# Patient Record
Sex: Male | Born: 1985 | Race: Black or African American | Hispanic: No | Marital: Single | Smoking: Current every day smoker
Health system: Southern US, Community
[De-identification: ages and names within clinical notes are randomized; demographics above are authoritative.]

---

## 2014-11-19 DIAGNOSIS — N508 Other specified disorders of male genital organs: Secondary | ICD-10-CM | POA: Insufficient documentation

## 2014-11-19 DIAGNOSIS — Z720 Tobacco use: Secondary | ICD-10-CM | POA: Insufficient documentation

## 2014-11-19 DIAGNOSIS — L98499 Non-pressure chronic ulcer of skin of other sites with unspecified severity: Secondary | ICD-10-CM | POA: Insufficient documentation

## 2014-11-20 ENCOUNTER — Emergency Department (HOSPITAL_COMMUNITY)
Admission: EM | Admit: 2014-11-20 | Discharge: 2014-11-20 | Disposition: A | Discharge status: Home / Self Care | Payer: Self-pay | Attending: Emergency Medicine | Admitting: Emergency Medicine

## 2014-11-20 ENCOUNTER — Emergency Department (HOSPITAL_COMMUNITY): Payer: Self-pay

## 2014-11-20 ENCOUNTER — Encounter (HOSPITAL_COMMUNITY): Payer: Self-pay | Admitting: Emergency Medicine

## 2014-11-20 DIAGNOSIS — N50812 Left testicular pain: Secondary | ICD-10-CM

## 2014-11-20 DIAGNOSIS — N5089 Other specified disorders of the male genital organs: Secondary | ICD-10-CM

## 2014-11-20 LAB — URINALYSIS, ROUTINE W REFLEX MICROSCOPIC
Bilirubin Urine: NEGATIVE
GLUCOSE, UA: NEGATIVE mg/dL
HGB URINE DIPSTICK: NEGATIVE
KETONES UR: 15 mg/dL — AB
Leukocytes, UA: NEGATIVE
Nitrite: NEGATIVE
PROTEIN: NEGATIVE mg/dL
Specific Gravity, Urine: 1.026 (ref 1.005–1.030)
Urobilinogen, UA: 1 mg/dL (ref 0.0–1.0)
pH: 6.5 (ref 5.0–8.0)

## 2014-11-20 LAB — GC/CHLAMYDIA PROBE AMP (~~LOC~~) NOT AT ARMC
CHLAMYDIA, DNA PROBE: NEGATIVE
NEISSERIA GONORRHEA: NEGATIVE

## 2014-11-20 MED ORDER — MORPHINE SULFATE 4 MG/ML IJ SOLN
4.0000 mg | Freq: Once | INTRAMUSCULAR | Status: AC
  Administered 2014-11-20: 4 mg via INTRAVENOUS
  Filled 2014-11-20: qty 1

## 2014-11-20 MED ORDER — SODIUM CHLORIDE 0.9 % IV BOLUS (SEPSIS)
1000.0000 mL | Freq: Once | INTRAVENOUS | Status: AC
  Administered 2014-11-20: 1000 mL via INTRAVENOUS

## 2014-11-20 MED ORDER — NAPROXEN 500 MG PO TABS: 500.0000 mg | ORAL_TABLET | Freq: Two times a day (BID) | ORAL | Status: AC

## 2014-11-20 NOTE — Discharge Instructions (Signed)
Pain of Unknown Etiology (Pain Without a Known Cause) °You have come to your caregiver because of pain. Pain can occur in any part of the body. Often there is not a definite cause. If your laboratory (blood or urine) work was normal and X-rays or other studies were normal, your caregiver may treat you without knowing the cause of the pain. An example of this is the headache. Most headaches are diagnosed by taking a history. This means your caregiver asks you questions about your headaches. Your caregiver determines a treatment based on your answers. Usually testing done for headaches is normal. Often testing is not done unless there is no response to medications. Regardless of where your pain is located today, you can be given medications to make you comfortable. If no physical cause of pain can be found, most cases of pain will gradually leave as suddenly as they came.  °If you have a painful condition and no reason can be found for the pain, it is important that you follow up with your caregiver. If the pain becomes worse or does not go away, it may be necessary to repeat tests and look further for a possible cause. °· Only take over-the-counter or prescription medicines for pain, discomfort, or fever as directed by your caregiver. °· For the protection of your privacy, test results cannot be given over the phone. Make sure you receive the results of your test. Ask how these results are to be obtained if you have not been informed. It is your responsibility to obtain your test results. °· You may continue all activities unless the activities cause more pain. When the pain lessens, it is important to gradually resume normal activities. Resume activities by beginning slowly and gradually increasing the intensity and duration of the activities or exercise. During periods of severe pain, bed rest may be helpful. Lie or sit in any position that is comfortable. °· Ice used for acute (sudden) conditions may be effective.  Use a large plastic bag filled with ice and wrapped in a towel. This may provide pain relief. °· See your caregiver for continued problems. Your caregiver can help or refer you for exercises or physical therapy if necessary. °If you were given medications for your condition, do not drive, operate machinery or power tools, or sign legal documents for 24 hours. Do not drink alcohol, take sleeping pills, or take other medications that may interfere with treatment. °See your caregiver immediately if you have pain that is becoming worse and not relieved by medications. °Document Released: 07/13/2001 Document Revised: 08/08/2013 Document Reviewed: 10/18/2005 °[employer]® Patient Information ©2015 [employer]. This information is not intended to replace advice given to you by your health care provider. Make sure you discuss any questions you have with your health care provider. ° °

## 2014-11-20 NOTE — ED Notes (Signed)
Pt. reports bilateral testicular pain and left lower back pain onset this today , denies injury , no fever or chills.

## 2014-11-20 NOTE — ED Notes (Signed)
PA-C at bedside 

## 2014-11-20 NOTE — ED Provider Notes (Signed)
CSN: [id number]     Arrival date & time 11/19/14  2356 History   First MD Initiated Contact with Patient 11/20/14 0004     Chief Complaint  Patient presents with  . Testicle Pain  . Back Pain     (Consider location/radiation/quality/duration/timing/severity/associated sxs/prior Treatment) HPI Comments: The patient is a 29 year old male presenting emergency room chief complaint of bilateral testicular pain since today. Patient reports onset of discomfort while showering. He reports left testicular discomfort greater than right. He denies swelling, lesion. Patient denies relief with elevation of testes, worsened with movement or other aggravating factors. He reports left testicular pain, with radiation into the back, constant. Denies abdominal pain, nausea, vomiting, diarrhea, constipation, dysuria. Patient also reports a rash to right penis for approximately one week. He reports this is resolving with hydrocortisone cream he denies discomfort or prodrome to the area. Patient denies history of herpes.  The history is provided by the patient. No language interpreter was used.    History reviewed. No pertinent past medical history. History reviewed. No pertinent past surgical history. No family history on file. History  Substance Use Topics  . Smoking status: Current Every Day Smoker  . Smokeless tobacco: Not on file  . Alcohol Use: Yes    Review of Systems  Gastrointestinal: Negative for nausea, vomiting, abdominal pain and diarrhea.  Genitourinary: Positive for genital sores and testicular pain. Negative for dysuria, hematuria, discharge and scrotal swelling.  Musculoskeletal: Positive for back pain.      Allergies  Review of patient's allergies indicates no known allergies.  Home Medications   Prior to Admission medications   Not on File   BP 103/69 mmHg  Pulse 92  Temp(Src) 98 F (36.7 C) (Oral)  Resp 16  Ht 5\' 8"  (1.727 m)  Wt 140 lb (63.504 kg)  BMI 21.29 kg/m2   SpO2 97% Physical Exam  Constitutional: He is oriented to person, place, and time. He appears well-developed and well-nourished. No distress.  HENT:  Head: Normocephalic and atraumatic.  Eyes: EOM are normal.  Neck: Neck supple.  Abdominal: Soft. There is no tenderness.  Genitourinary: Right testis shows no swelling and no tenderness. Left testis shows tenderness. Left testis shows no mass. No discharge found.  Grouped ulcerative lesions to right penis shaft.  Negative Prehn's sign.  No scrotal skin changes. Chaperone present.   Musculoskeletal: Normal range of motion.  Lymphadenopathy:       Right: No inguinal adenopathy present.       Left: No inguinal adenopathy present.  Neurological: He is alert and oriented to person, place, and time.  Skin: Skin is warm and dry.  Psychiatric: He has a normal mood and affect. His behavior is normal.  Nursing note and vitals reviewed.   ED Course  Procedures (including critical care time) Labs Review Labs Reviewed  RPR  HIV ANTIBODY (ROUTINE TESTING)  URINALYSIS, ROUTINE W REFLEX MICROSCOPIC  GC/CHLAMYDIA PROBE AMP ([place])    Imaging Review No results found.   EKG Interpretation None      MDM   Final diagnoses:  Testicular pain, left  Genital lesion, male   Pt with left testicular pain, no swelling or point tenderness. Pt with lesions to penis consistent with herpetic rash.  Plan to US to rule out torsion. Pt care assumed by Dr. Micheline Mazeocherty at shift change.    Mellody DrownLauren Morghan Kester, PA-C 11/20/14 [phone]  Toy CookeyMegan Docherty, MD 11/22/14 1058

## 2014-11-21 LAB — HIV ANTIBODY (ROUTINE TESTING W REFLEX): HIV 1/HIV 2 AB: NONREACTIVE

## 2014-11-21 LAB — RPR: RPR Ser Ql: NONREACTIVE

## 2016-04-16 ENCOUNTER — Emergency Department (HOSPITAL_COMMUNITY): Admission: EM | Admit: 2016-04-16 | Discharge: 2016-04-16 | Discharge status: Absent without leave | Payer: Self-pay

## 2016-04-16 NOTE — ED Notes (Signed)
Called for triage a second time and no answer

## 2016-04-16 NOTE — ED Notes (Signed)
Called 2x for triage and no answer

## 2016-06-17 IMAGING — US US ART/VEN ABD/PELV/SCROTUM DOPPLER LTD
1 series · 14 of 25 positions shown · non-contrast
Comparison: None.

CLINICAL DATA: Testicular pain on the left

EXAM:
SCROTAL ULTRASOUND
DOPPLER ULTRASOUND OF THE TESTICLES
TECHNIQUE: Complete ultrasound examination of the testicles, epididymis, and
other scrotal structures was performed. Color and spectral Doppler
ultrasound were also utilized to evaluate blood flow to the
testicles.

[Series 1: us art/ven abd/pelv/scrotum doppler ltd · 0.06mm/px · 14 of 49 slices shown]
[im 1/49]
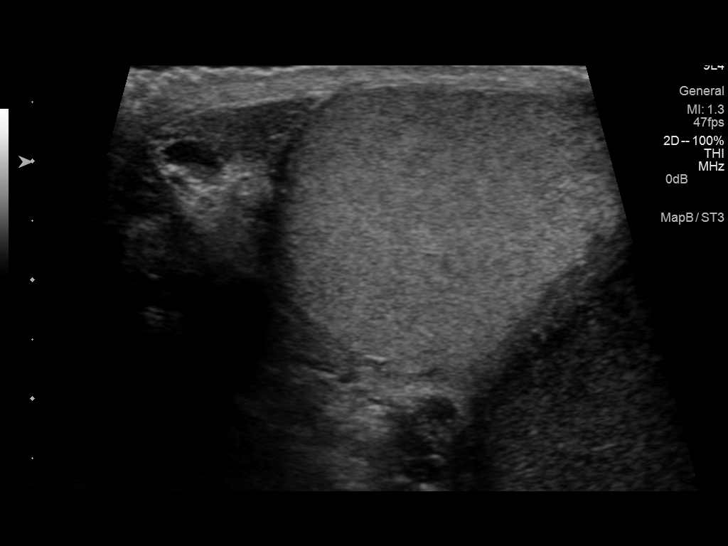
[im 5/49]
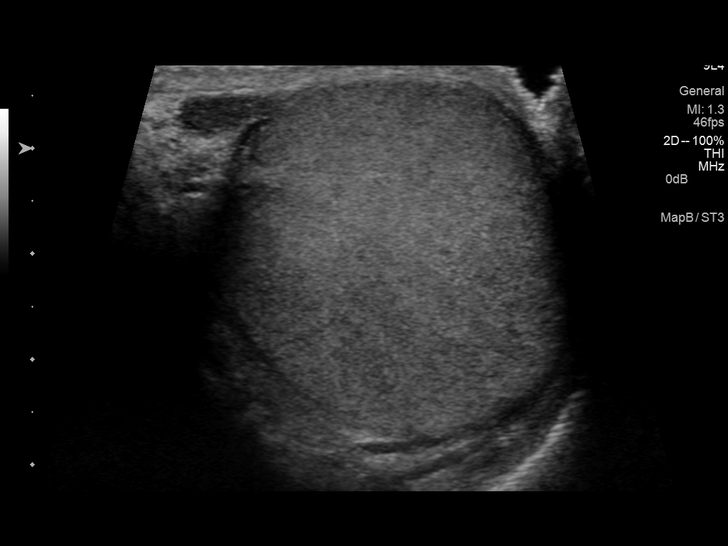
[im 9/49]
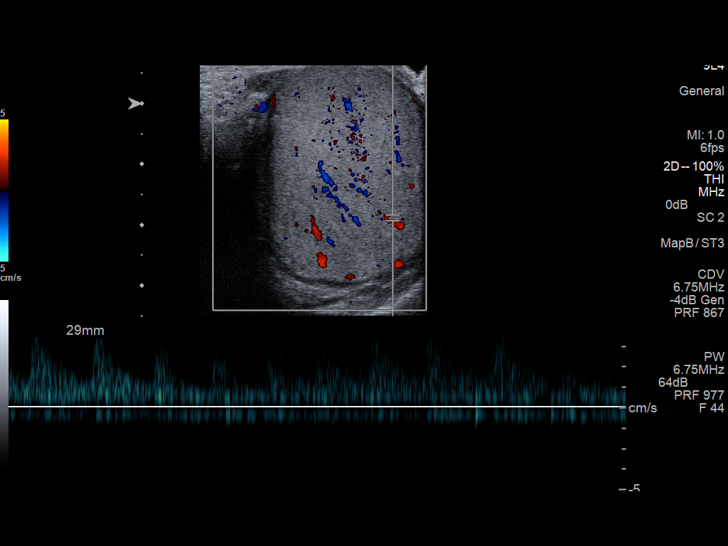
[im 13/49]
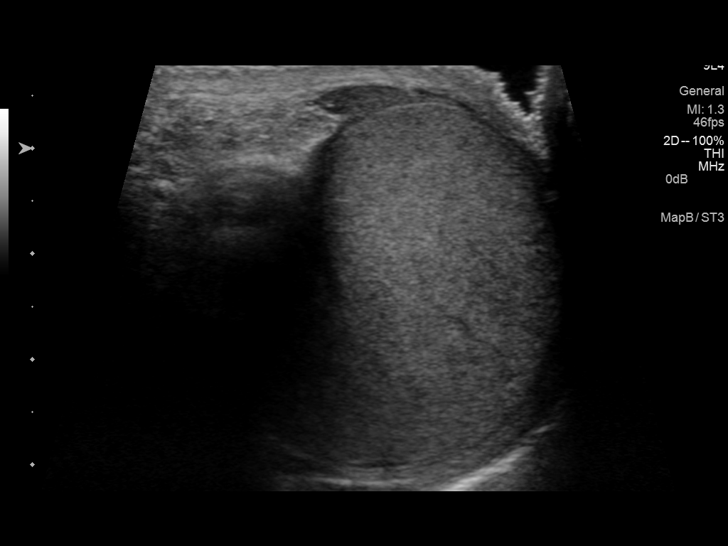
[im 17/49]
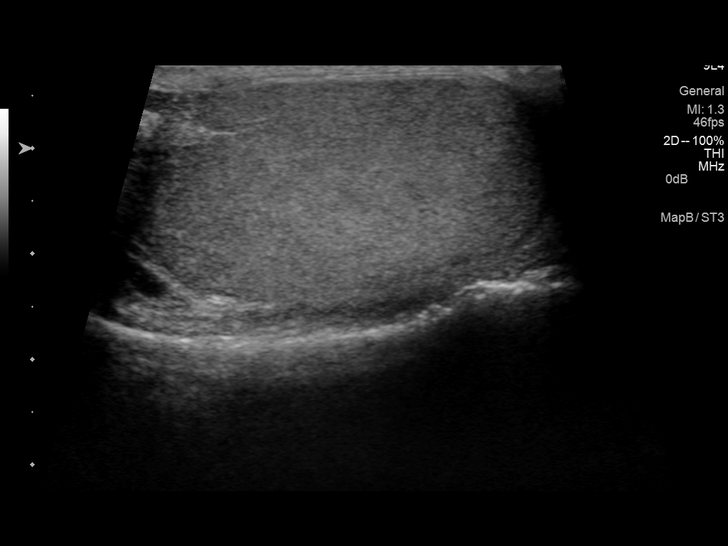
[im 19/49]
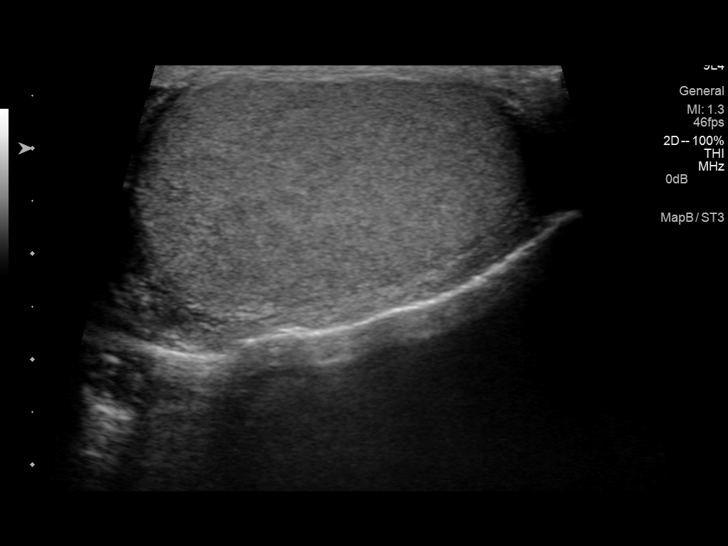
[im 23/49]
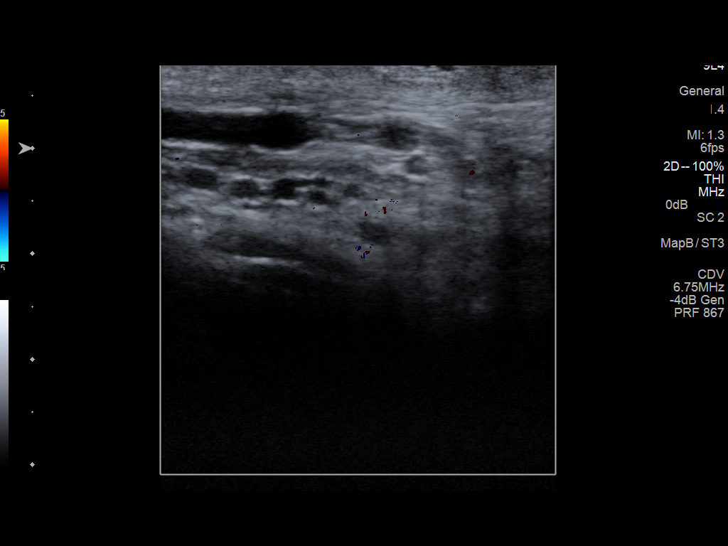
[im 27/49]
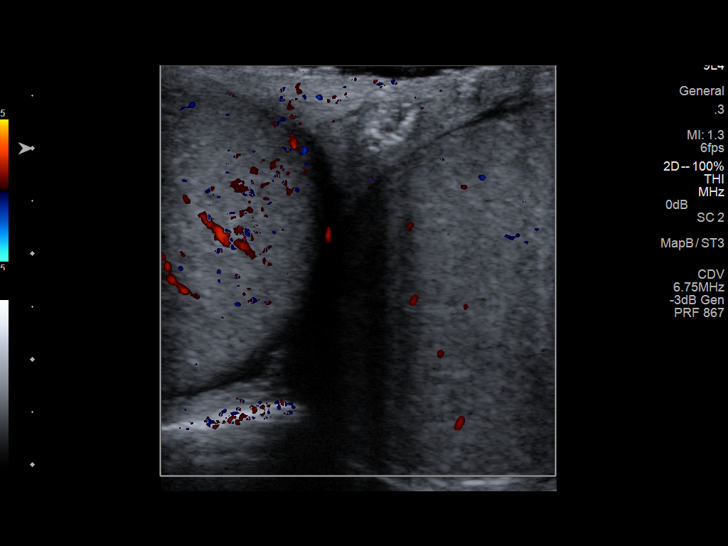
[im 31/49]
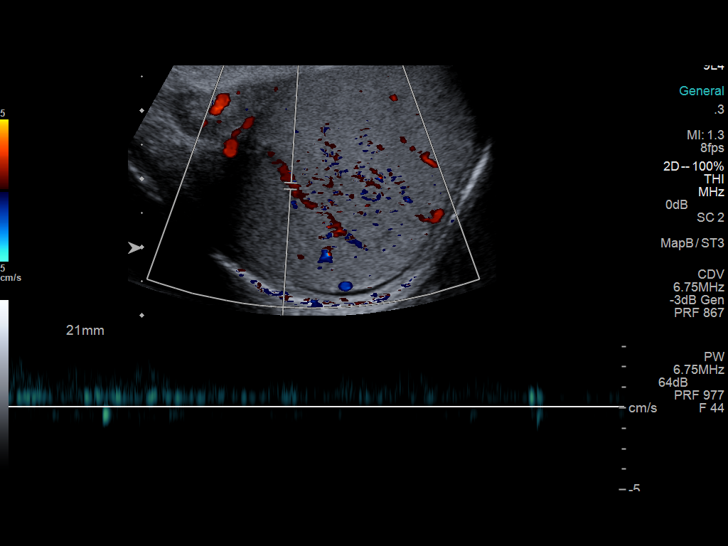
[im 33/49]
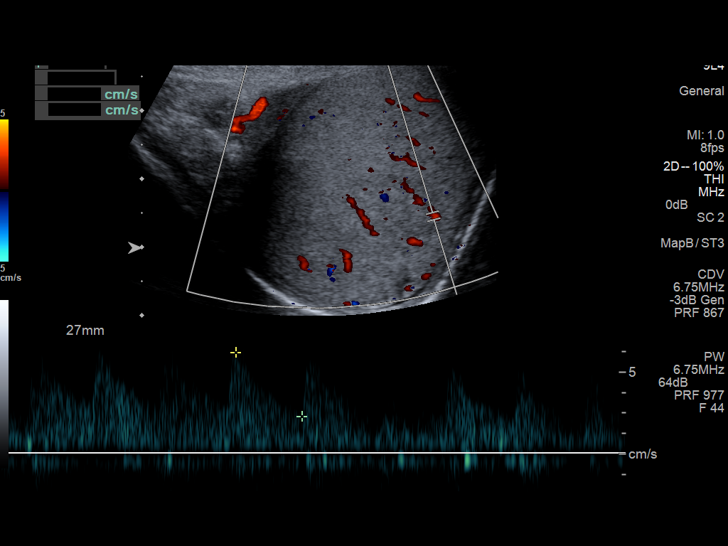
[im 37/49]
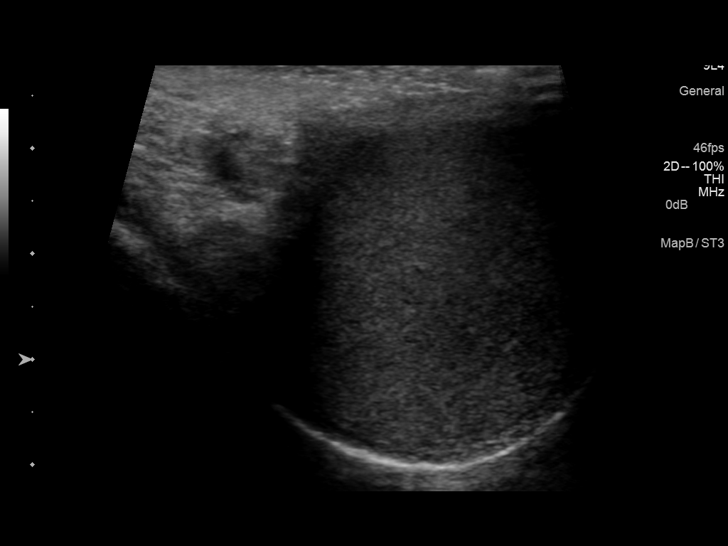
[im 41/49]
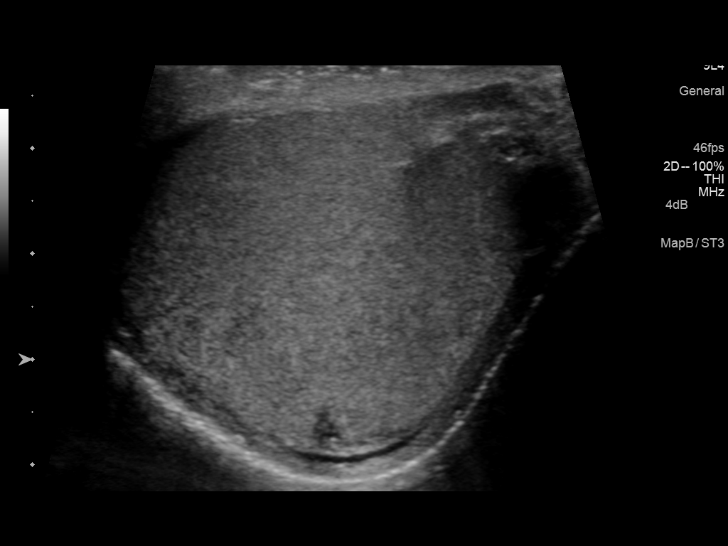
[im 45/49]
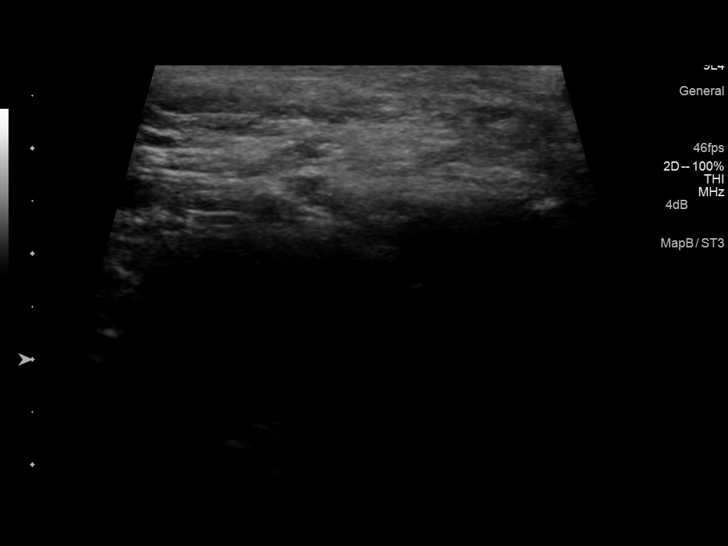
[im 49/49]
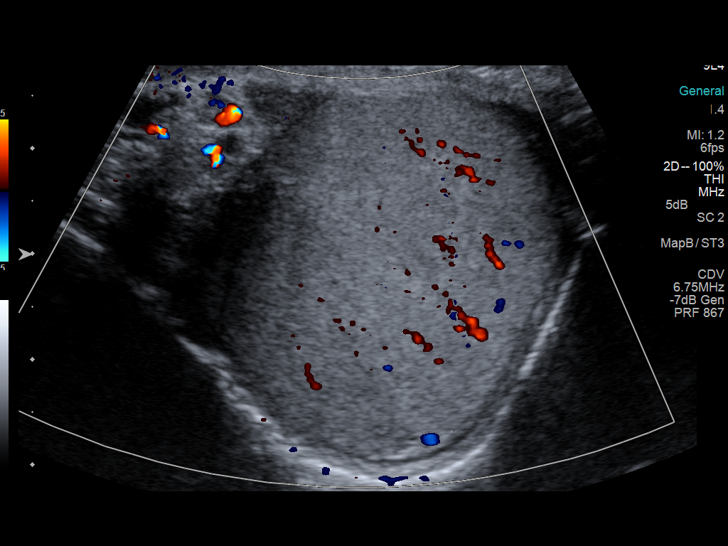

[14 of 25 positions shown; findings below may reference images not displayed]

FINDINGS: Right testicle

Measurements: 3.7 x 3.2 x 4 cm. No mass or microlithiasis
visualized.

Left testicle

Measurements: 3.5 x 3 x 3.9 cm. No mass or microlithiasis
visualized.

Right epididymis:  Normal in size and appearance.

Left epididymis:  Normal in size and appearance.

Hydrocele:  None visualized.

Varicocele:  None visualized.

Pulsed Doppler interrogation of both testes demonstrates low
resistance arterial and venous waveforms bilaterally.
IMPRESSION: Negative scrotal ultrasound.

## 2017-03-20 ENCOUNTER — Encounter (HOSPITAL_COMMUNITY): Payer: Self-pay | Admitting: Emergency Medicine

## 2017-03-20 ENCOUNTER — Emergency Department (HOSPITAL_COMMUNITY)
Admission: EM | Admit: 2017-03-20 | Discharge: 2017-03-20 | Disposition: A | Discharge status: Home / Self Care | Payer: BLUE CROSS/BLUE SHIELD | Attending: Emergency Medicine | Admitting: Emergency Medicine

## 2017-03-20 DIAGNOSIS — X58XXXA Exposure to other specified factors, initial encounter: Secondary | ICD-10-CM | POA: Insufficient documentation

## 2017-03-20 DIAGNOSIS — Y939 Activity, unspecified: Secondary | ICD-10-CM | POA: Insufficient documentation

## 2017-03-20 DIAGNOSIS — Y929 Unspecified place or not applicable: Secondary | ICD-10-CM | POA: Diagnosis not present

## 2017-03-20 DIAGNOSIS — F17200 Nicotine dependence, unspecified, uncomplicated: Secondary | ICD-10-CM | POA: Diagnosis not present

## 2017-03-20 DIAGNOSIS — S0502XA Injury of conjunctiva and corneal abrasion without foreign body, left eye, initial encounter: Secondary | ICD-10-CM | POA: Diagnosis not present

## 2017-03-20 DIAGNOSIS — Y999 Unspecified external cause status: Secondary | ICD-10-CM | POA: Diagnosis not present

## 2017-03-20 DIAGNOSIS — S0592XA Unspecified injury of left eye and orbit, initial encounter: Secondary | ICD-10-CM | POA: Diagnosis present

## 2017-03-20 DIAGNOSIS — Z79899 Other long term (current) drug therapy: Secondary | ICD-10-CM | POA: Insufficient documentation

## 2017-03-20 MED ORDER — TETRACAINE HCL 0.5 % OP SOLN
1.0000 [drp] | Freq: Once | OPHTHALMIC | Status: AC
  Administered 2017-03-20: 1 [drp] via OPHTHALMIC
  Filled 2017-03-20: qty 4

## 2017-03-20 MED ORDER — TETRACAINE HCL 0.5 % OP SOLN: 1.0000 [drp] | Freq: Once | OPHTHALMIC | Status: DC

## 2017-03-20 MED ORDER — FLUORESCEIN SODIUM 0.6 MG OP STRP: 1.0000 | ORAL_STRIP | Freq: Once | OPHTHALMIC | Status: DC

## 2017-03-20 MED ORDER — FLUORESCEIN SODIUM 0.6 MG OP STRP
2.0000 | ORAL_STRIP | Freq: Once | OPHTHALMIC | Status: AC
  Administered 2017-03-20: 2 via OPHTHALMIC
  Filled 2017-03-20: qty 2

## 2017-03-20 MED ORDER — ERYTHROMYCIN 5 MG/GM OP OINT: TOPICAL_OINTMENT | OPHTHALMIC | 0 refills | Status: AC

## 2017-03-20 NOTE — ED Provider Notes (Signed)
[facility]-EMERGENCY DEPT Provider Note    By signing my name below, I, Earmon Phoenix, attest that this documentation has been prepared under the direction and in the presence of Terance Hart, PA-C. Electronically Signed: Earmon Phoenix, ED Scribe. 03/20/17. 5:04 PM.    History   Chief Complaint Chief Complaint  Patient presents with  . Eye Drainage   The history is provided by the patient and medical records. No language interpreter was used.    Darin Mendoza is a 31 y.o. male who presents to the Emergency Department complaining of left eye redness that began three days ago. He reports associated tearing and pain. He reports some swelling below the eye that began today. He states he feels as if something is in the eye. He states he has experienced nausea today. He was seen by an optometrist two days ago and was told he had ingrowing eye lashes and was prescribed an ophthalmic drop for redness. There are no modifying factors noted. He denies fever, chills, rhinorrhea, photophobia, vomiting. He reports wearing corrective lenses but denies wearing contact lenses.   History reviewed. No pertinent past medical history.  There are no active problems to display for this patient.   History reviewed. No pertinent surgical history.     Home Medications    Prior to Admission medications   Medication Sig Start Date End Date Taking? Authorizing Provider  naproxen (NAPROSYN) 500 MG tablet Take 1 tablet (500 mg total) by mouth 2 (two) times daily with a meal. 11/20/14   Toy Cookey, MD    Family History No family history on file.  Social History Social History  Substance Use Topics  . Smoking status: Current Every Day Smoker  . Smokeless tobacco: Not on file  . Alcohol use Yes     Allergies   Patient has no known allergies.   Review of Systems Review of Systems  Constitutional: Negative for chills and fever.  HENT: Negative for rhinorrhea.   Eyes: Positive for pain  (irritation), discharge and redness. Negative for photophobia and visual disturbance.  Gastrointestinal: Positive for nausea. Negative for vomiting.     Physical Exam Updated Vital Signs BP (!) 121/95 (BP Location: Left Arm)   Pulse 62   Temp 97.9 F (36.6 C) (Oral)   Resp 18   Ht 5\' 7"  (1.702 m)   Wt 145 lb (65.8 kg)   SpO2 100%   BMI 22.71 kg/m   Physical Exam  Constitutional: He is oriented to person, place, and time. He appears well-developed and well-nourished.  HENT:  Head: Normocephalic and atraumatic.  Eyes: EOM and lids are normal. Pupils are equal, round, and reactive to light. Lids are everted and swept, no foreign bodies found. No foreign body present in the right eye. No foreign body present in the left eye. Right conjunctiva is not injected. Right conjunctiva has no hemorrhage. Left conjunctiva is injected. Left conjunctiva has no hemorrhage.  Slit lamp exam:      The right eye shows no foreign body.       The left eye shows corneal abrasion and fluorescein uptake. The left eye shows no foreign body.    Visual Acuity  Right Eye Distance: 20/15 Left Eye Distance: 20/15 Bilateral Distance: 20/15  Right Eye Near:   Left Eye Near:    Bilateral Near:     Mild swelling of left eye. Injected and tearing. Corneal abrasion noted to the lateral aspect of the left eye.  Neck: Normal range of motion.  Cardiovascular:  Normal rate.   Pulmonary/Chest: Effort normal.  Musculoskeletal: Normal range of motion.  Neurological: He is alert and oriented to person, place, and time.  Skin: Skin is warm and dry.  Psychiatric: He has a normal mood and affect. His behavior is normal.  Nursing note and vitals reviewed.    ED Treatments / Results  DIAGNOSTIC STUDIES: Oxygen Saturation is 100% on RA, normal by my interpretation.   COORDINATION OF CARE: 4:42 PM- Will instill tetracaine drops and apply fluorescein strip to check for abrasions/lacerations. Pt verbalizes  understanding and agrees to plan.  Medications  tetracaine (PONTOCAINE) 0.5 % ophthalmic solution 1 drop (not administered)  fluorescein ophthalmic strip 2 strip (not administered)  fluorescein ophthalmic strip 1 strip (not administered)  tetracaine (PONTOCAINE) 0.5 % ophthalmic solution 1 drop (not administered)    Labs (all labs ordered are listed, but only abnormal results are displayed) Labs Reviewed - No data to display  EKG  EKG Interpretation None       Radiology No results found.  Procedures Procedures (including critical care time)  Medications Ordered in ED Medications  tetracaine (PONTOCAINE) 0.5 % ophthalmic solution 1 drop (1 drop Both Eyes Given by Other 03/20/17 1727)  fluorescein ophthalmic strip 2 strip (2 strips Both Eyes Given 03/20/17 1727)     Initial Impression / Assessment and Plan / ED Course  I have reviewed the triage vital signs and the nursing notes.  Pertinent labs & imaging results that were available during my care of the patient were reviewed by me and considered in my medical decision making (see chart for details).  31 year old with L corneal abrasion on exam. Visual acuity is normal. He has minimal pain. Will rx Erythromycin ointment and referral to Opthalmology given. Return precautions given.  Final Clinical Impressions(s) / ED Diagnoses   Final diagnoses:  Abrasion of left cornea, initial encounter    New Prescriptions New Prescriptions   No medications on file    I personally performed the services described in this documentation, which was scribed in my presence. The recorded information has been reviewed and is accurate.     Bethel BornGekas, Haydyn Liddell Marie, PA-C 03/22/17 [phone]    Tilden Fossaees, Elizabeth, MD 03/23/17 [phone]

## 2017-03-20 NOTE — Discharge Instructions (Signed)
Use antibiotic 4 times a day for 5 days Follow up with eye doctor if not getting better

## 2017-03-20 NOTE — ED Triage Notes (Signed)
Patient c/o let eye watering, redness and minor swelling since Thursday. Pt states went to eye doctor Friday and was sent home with eye drops. Doctor told pt that his eye was irritated but no signs of infection. States no relief and wants to get eye checked again.

## 2022-06-04 ENCOUNTER — Emergency Department (HOSPITAL_COMMUNITY)
Admission: EM | Admit: 2022-06-04 | Discharge: 2022-06-05 | Discharge status: Against Medical Advice | Payer: 59 | Attending: Emergency Medicine | Admitting: Emergency Medicine

## 2022-06-04 ENCOUNTER — Other Ambulatory Visit: Payer: Self-pay

## 2022-06-04 ENCOUNTER — Emergency Department (HOSPITAL_COMMUNITY): Payer: 59

## 2022-06-04 DIAGNOSIS — Z5329 Procedure and treatment not carried out because of patient's decision for other reasons: Secondary | ICD-10-CM | POA: Insufficient documentation

## 2022-06-04 DIAGNOSIS — M25512 Pain in left shoulder: Secondary | ICD-10-CM | POA: Insufficient documentation

## 2022-06-04 NOTE — ED Notes (Signed)
Called x 1 no answer

## 2022-06-04 NOTE — ED Triage Notes (Signed)
Pt c/o left arm pain. States he was sparing yesterday when he feels like he over extended his left arm. Arm pain has been ongoing since radiating to left shoulder and neck. Denies any other injuries. No deformity noted. ROM intact. Took ibuprofen approx 1 hr prior to arrival with improvement of pain.

## 2022-06-04 NOTE — ED Provider Triage Note (Signed)
Emergency Medicine Provider Triage Evaluation Note  Darin Mendoza , a 36 y.o. male  was evaluated in triage.  Pt complains of left shoulder pain.  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  He states that he is having ongoing pain primarily in the left shoulder but is also having muscular pain of his left tricep and into his left trapezius..  Review of Systems  Positive:  Negative:   Physical Exam  BP (!) 143/79   Pulse 62   Temp 97.8 F (36.6 C) (Oral)   Resp 16   Ht 5\' 7"  (1.702 m)   Wt 68 kg   SpO2 97%   BMI 23.49 kg/m  Gen:   Awake, no distress   Resp:  Normal effort  MSK:   Moves extremities without difficulty.  Pain to flexion of the shoulder Other:    Medical Decision Making  Medically screening exam initiated at 7:36 PM.  Appropriate orders placed.  Darin Mendoza was informed that the remainder of the evaluation will be completed by another provider, this initial triage assessment does not replace that evaluation, and the importance of remaining in the ED until their evaluation is complete.     Kari Baars, PA-C 06/04/22 [phone]

## 2022-06-04 NOTE — ED Notes (Signed)
Called for a room x3 with no answer. Pt was not in any scans and was not in the bathroom.
